# Patient Record
Sex: Female | Born: 1960 | Race: White | Hispanic: No | Marital: Married | Smoking: Current some day smoker
Health system: Southern US, Community
[De-identification: ages and names within clinical notes are randomized; demographics above are authoritative.]

## PROBLEM LIST (undated history)

## (undated) HISTORY — PX: BREAST EXCISIONAL BIOPSY: SUR124

## (undated) HISTORY — PX: NECK SURGERY: SHX720

---

## 2012-07-10 DIAGNOSIS — G4700 Insomnia, unspecified: Secondary | ICD-10-CM | POA: Insufficient documentation

## 2012-07-10 DIAGNOSIS — F419 Anxiety disorder, unspecified: Secondary | ICD-10-CM | POA: Insufficient documentation

## 2013-02-19 DIAGNOSIS — J309 Allergic rhinitis, unspecified: Secondary | ICD-10-CM | POA: Insufficient documentation

## 2013-10-17 ENCOUNTER — Other Ambulatory Visit: Payer: Self-pay

## 2013-10-17 DIAGNOSIS — Z1231 Encounter for screening mammogram for malignant neoplasm of breast: Secondary | ICD-10-CM

## 2013-11-07 ENCOUNTER — Ambulatory Visit: Payer: Self-pay

## 2013-11-07 ENCOUNTER — Ambulatory Visit
Admission: RE | Admit: 2013-11-07 | Discharge: 2013-11-07 | Disposition: A | Discharge status: Home / Self Care | Payer: BC Managed Care – PPO | Source: Ambulatory Visit

## 2013-11-07 ENCOUNTER — Encounter (INDEPENDENT_AMBULATORY_CARE_PROVIDER_SITE_OTHER): Payer: Self-pay

## 2013-11-07 DIAGNOSIS — Z1231 Encounter for screening mammogram for malignant neoplasm of breast: Secondary | ICD-10-CM

## 2013-12-03 ENCOUNTER — Emergency Department: Payer: Self-pay | Admitting: Emergency Medicine

## 2013-12-03 LAB — CBC WITH DIFFERENTIAL/PLATELET
Basophil #: 0 10*3/uL (ref 0.0–0.1)
Basophil %: 0.2 %
EOS PCT: 1.8 %
Eosinophil #: 0.1 10*3/uL (ref 0.0–0.7)
HCT: 40.3 % (ref 35.0–47.0)
HGB: 13.2 g/dL (ref 12.0–16.0)
LYMPHS PCT: 33.4 %
Lymphocyte #: 2.1 10*3/uL (ref 1.0–3.6)
MCH: 31.8 pg (ref 26.0–34.0)
MCHC: 32.7 g/dL (ref 32.0–36.0)
MCV: 97 fL (ref 80–100)
Monocyte #: 0.5 x10 3/mm (ref 0.2–0.9)
Monocyte %: 8.8 %
NEUTROS ABS: 3.4 10*3/uL (ref 1.4–6.5)
NEUTROS PCT: 55.8 %
Platelet: 263 10*3/uL (ref 150–440)
RBC: 4.15 10*6/uL (ref 3.80–5.20)
RDW: 12.1 % (ref 11.5–14.5)
WBC: 6.2 10*3/uL (ref 3.6–11.0)

## 2013-12-03 LAB — COMPREHENSIVE METABOLIC PANEL
ALBUMIN: 3.7 g/dL (ref 3.4–5.0)
ALK PHOS: 63 U/L
ALT: 30 U/L
AST: 30 U/L (ref 15–37)
Anion Gap: 6 — ABNORMAL LOW (ref 7–16)
BUN: 13 mg/dL (ref 7–18)
Bilirubin,Total: 0.3 mg/dL (ref 0.2–1.0)
CHLORIDE: 103 mmol/L (ref 98–107)
Calcium, Total: 8.7 mg/dL (ref 8.5–10.1)
Co2: 31 mmol/L (ref 21–32)
Creatinine: 0.68 mg/dL (ref 0.60–1.30)
EGFR (African American): >60
GLUCOSE: 89 mg/dL (ref 65–99)
Osmolality: 279 (ref 275–301)
Potassium: 3.8 mmol/L (ref 3.5–5.1)
Sodium: 140 mmol/L (ref 136–145)
TOTAL PROTEIN: 6.8 g/dL (ref 6.4–8.2)

## 2013-12-03 LAB — PROTIME-INR
INR: 1
PROTHROMBIN TIME: 12.7 s (ref 11.5–14.7)

## 2013-12-03 LAB — CK TOTAL AND CKMB (NOT AT ARMC)
CK, Total: 57 U/L
CK-MB: 1 ng/mL (ref 0.5–3.6)

## 2013-12-03 LAB — TROPONIN I

## 2013-12-03 LAB — D-DIMER(ARMC): D-Dimer: 297 ng/ml

## 2014-01-01 ENCOUNTER — Other Ambulatory Visit: Payer: Self-pay | Admitting: Neurosurgery

## 2014-01-01 DIAGNOSIS — M5412 Radiculopathy, cervical region: Secondary | ICD-10-CM

## 2014-01-03 ENCOUNTER — Ambulatory Visit
Admission: RE | Admit: 2014-01-03 | Discharge: 2014-01-03 | Disposition: A | Discharge status: Home / Self Care | Payer: BC Managed Care – PPO | Source: Ambulatory Visit | Attending: Neurosurgery | Admitting: Neurosurgery

## 2014-01-03 VITALS — BP 140/81 | HR 80

## 2014-01-03 DIAGNOSIS — M5412 Radiculopathy, cervical region: Secondary | ICD-10-CM

## 2014-01-03 MED ORDER — HYDROMORPHONE HCL 1 MG/ML IJ SOLN
1.0000 mg | Freq: Once | INTRAMUSCULAR | Status: AC
  Administered 2014-01-03: 1 mg via INTRAMUSCULAR

## 2014-01-03 MED ORDER — IOHEXOL 300 MG/ML  SOLN
10.0000 mL | Freq: Once | INTRAMUSCULAR | Status: AC | PRN
  Administered 2014-01-03: 10 mL via INTRATHECAL

## 2014-01-03 MED ORDER — ONDANSETRON HCL 4 MG/2ML IJ SOLN
4.0000 mg | Freq: Once | INTRAMUSCULAR | Status: AC
  Administered 2014-01-03: 4 mg via INTRAMUSCULAR

## 2014-01-03 MED ORDER — DIAZEPAM 5 MG PO TABS
5.0000 mg | ORAL_TABLET | Freq: Once | ORAL | Status: AC
  Administered 2014-01-03: 5 mg via ORAL

## 2014-01-03 NOTE — Discharge Instructions (Signed)
Myelogram Discharge Instructions  1. Go home and rest quietly for the next 24 hours.  It is important to lie flat for the next 24 hours.  Get up only to go to the restroom.  You may lie in the bed or on a couch on your back, your stomach, your left side or your right side.  You may have one pillow under your head.  You may have pillows between your knees while you are on your side or under your knees while you are on your back.  2. DO NOT drive today.  Recline the seat as far back as it will go, while still wearing your seat belt, on the way home.  3. You may get up to go to the bathroom as needed.  You may sit up for 10 minutes to eat.  You may resume your normal diet and medications unless otherwise indicated.  Drink plenty of extra fluids today and tomorrow.  4. The incidence of a spinal headache with nausea and/or vomiting is about 5% (one in 20 patients).  If you develop a headache, lie flat and drink plenty of fluids until the headache goes away.  Caffeinated beverages may be helpful.  If you develop severe nausea and vomiting or a headache that does not go away with flat bed rest, call [phone].  5. You may resume normal activities after your 24 hours of bed rest is over; however, do not exert yourself strongly or do any heavy lifting tomorrow.  6. Call your physician for a follow-up appointment.   You may resume Zoloft on Saturday, January 04, 2014 after 11:30a.m.

## 2014-04-04 DIAGNOSIS — I471 Supraventricular tachycardia, unspecified: Secondary | ICD-10-CM

## 2014-04-04 HISTORY — DX: Supraventricular tachycardia, unspecified: I47.10

## 2014-04-04 HISTORY — DX: Supraventricular tachycardia: I47.1

## 2014-11-24 HISTORY — PX: SVT ABLATION: EP1225

## 2017-04-19 ENCOUNTER — Other Ambulatory Visit: Payer: Self-pay | Admitting: Internal Medicine

## 2017-04-19 DIAGNOSIS — R634 Abnormal weight loss: Secondary | ICD-10-CM

## 2017-04-19 DIAGNOSIS — Z1231 Encounter for screening mammogram for malignant neoplasm of breast: Secondary | ICD-10-CM

## 2017-04-19 DIAGNOSIS — E2839 Other primary ovarian failure: Secondary | ICD-10-CM

## 2017-05-10 ENCOUNTER — Ambulatory Visit: Payer: BC Managed Care – PPO

## 2017-05-10 ENCOUNTER — Inpatient Hospital Stay
Admission: RE | Admit: 2017-05-10 | Discharge: 2017-05-10 | Disposition: A | Discharge status: Home / Self Care | Payer: BC Managed Care – PPO | Source: Ambulatory Visit | Attending: Internal Medicine | Admitting: Internal Medicine

## 2017-06-29 ENCOUNTER — Ambulatory Visit: Payer: BC Managed Care – PPO

## 2017-06-29 ENCOUNTER — Inpatient Hospital Stay
Admission: RE | Admit: 2017-06-29 | Discharge: 2017-06-29 | Disposition: A | Discharge status: Home / Self Care | Payer: BC Managed Care – PPO | Source: Ambulatory Visit | Attending: Internal Medicine | Admitting: Internal Medicine

## 2017-08-17 ENCOUNTER — Inpatient Hospital Stay
Admission: RE | Admit: 2017-08-17 | Discharge: 2017-08-17 | Disposition: A | Discharge status: Home / Self Care | Payer: BC Managed Care – PPO | Source: Ambulatory Visit | Attending: Internal Medicine | Admitting: Internal Medicine

## 2017-08-17 ENCOUNTER — Ambulatory Visit: Payer: BC Managed Care – PPO

## 2017-10-18 ENCOUNTER — Other Ambulatory Visit: Payer: BC Managed Care – PPO

## 2017-10-18 ENCOUNTER — Ambulatory Visit: Payer: BC Managed Care – PPO

## 2018-01-10 ENCOUNTER — Encounter: Payer: Self-pay | Admitting: *Deleted

## 2018-12-20 ENCOUNTER — Other Ambulatory Visit: Payer: Self-pay | Admitting: Internal Medicine

## 2018-12-20 DIAGNOSIS — M810 Age-related osteoporosis without current pathological fracture: Secondary | ICD-10-CM

## 2018-12-20 DIAGNOSIS — Z1231 Encounter for screening mammogram for malignant neoplasm of breast: Secondary | ICD-10-CM

## 2018-12-21 ENCOUNTER — Telehealth: Payer: Self-pay

## 2018-12-21 DIAGNOSIS — Z1211 Encounter for screening for malignant neoplasm of colon: Secondary | ICD-10-CM

## 2018-12-21 MED ORDER — NA SULFATE-K SULFATE-MG SULF 17.5-3.13-1.6 GM/177ML PO SOLN: 1.0000 | Freq: Once | ORAL | 0 refills | Status: AC

## 2018-12-21 NOTE — Telephone Encounter (Signed)
Gastroenterology Pre-Procedure Review  Request Date: PENDING Requesting Physician: Dr. PENDING  PATIENT REVIEW QUESTIONS: The patient responded to the following health history questions as indicated:    1. Are you having any GI issues? no 2. Do you have a personal history of Polyps? no 3. Do you have a family history of Colon Cancer or Polyps? no 4. Diabetes Mellitus? no 5. Joint replacements in the past 12 months?no 6. Major health problems in the past 3 months?no 7. Any artificial heart valves, MVP, or defibrillator?no    MEDICATIONS & ALLERGIES:    Patient reports the following regarding taking any anticoagulation/antiplatelet therapy:   Plavix, Coumadin, Eliquis, Xarelto, Lovenox, Pradaxa, Brilinta, or Effient? no Aspirin? no  Patient confirms/reports the following medications:  Current Outpatient Medications  Medication Sig Dispense Refill  . albuterol (PROAIR HFA) 108 (90 BASE) MCG/ACT inhaler Inhale into the lungs.    . Ascorbic Acid (RA VITAMIN C/ROSE HIPS CR) 1000 MG TBCR Take 1,000 mg by mouth.    . budesonide (RHINOCORT AQUA) 32 MCG/ACT nasal spray Place into the nose.    . Calcium Carbonate-Vitamin D 600-400 MG-UNIT per tablet Take by mouth.    . fluticasone (FLONASE) 50 MCG/ACT nasal spray 1 spray by Each Nare route daily as needed for rhinitis.    . Multiple Vitamins-Iron (MULTIVITAMINS WITH IRON) TABS tablet Take by mouth.    . sertraline (ZOLOFT) 100 MG tablet TAKE 1 1/2 TABLETS BY MOUTH EVERY DAY     No current facility-administered medications for this visit.     Patient confirms/reports the following allergies:  Allergies  Allergen Reactions  . Cefuroxime Hives and Itching    No orders of the defined types were placed in this encounter.   AUTHORIZATION INFORMATION Primary Insurance: 1D#: Group #:  Secondary Insurance: 1D#: Group #:  SCHEDULE INFORMATION: Date: PENDING Time: Location:[facility]

## 2018-12-24 ENCOUNTER — Telehealth: Payer: Self-pay | Admitting: Gastroenterology

## 2018-12-24 NOTE — Telephone Encounter (Signed)
Patient called & was asking to schedule a colonoscopy and was ask to call Sharyn Lull back from on Monday.

## 2018-12-25 ENCOUNTER — Other Ambulatory Visit: Payer: Self-pay

## 2018-12-25 ENCOUNTER — Telehealth: Payer: Self-pay | Admitting: Gastroenterology

## 2018-12-25 DIAGNOSIS — Z1211 Encounter for screening for malignant neoplasm of colon: Secondary | ICD-10-CM

## 2018-12-25 NOTE — Telephone Encounter (Signed)
LVM returning patients call to schedule her colonoscopy.  Informed her that I am currently working from home, but call the main number and ask front office to tell Sharyn Lull to call her back to schedule.  Made her aware that I will be calling back from a restricted number.  Thanks, Sharyn Lull

## 2018-12-25 NOTE — Telephone Encounter (Signed)
Pt missed your call I have updated her number  For you to call her back she is aware of Private call

## 2018-12-25 NOTE — Telephone Encounter (Signed)
Colonoscopy scheduled with Dr. Allen Norris at [facility] on 02/22/19 pt advised to go for COVID testing on 02/19/19 at [place].  New instructions reflecting her procedure date will be mailed.  Thanks [employer]

## 2019-01-30 ENCOUNTER — Other Ambulatory Visit: Payer: Self-pay | Admitting: *Deleted

## 2019-01-30 DIAGNOSIS — Z20822 Contact with and (suspected) exposure to covid-19: Secondary | ICD-10-CM

## 2019-01-31 LAB — NOVEL CORONAVIRUS, NAA: SARS-CoV-2, NAA: NOT DETECTED

## 2019-02-18 ENCOUNTER — Other Ambulatory Visit: Payer: Self-pay

## 2019-02-18 ENCOUNTER — Encounter: Payer: Self-pay | Admitting: *Deleted

## 2019-02-18 NOTE — Anesthesia Preprocedure Evaluation (Addendum)
Anesthesia Evaluation  Patient identified by MRN, date of birth, ID band Patient awake    Reviewed: Allergy & Precautions, NPO status , Patient's Chart, lab work & pertinent test results  History of Anesthesia Complications Negative for: history of anesthetic complications  Airway Mallampati: II  TM Distance: >3 FB Neck ROM: Full    Dental   Pulmonary asthma , Current Smoker and Patient abstained from smoking.,    breath sounds clear to auscultation       Cardiovascular (-) angina(-) DOE + dysrhythmias (AVNRT s/p successful ablation)  Rhythm:Regular Rate:Normal     Neuro/Psych PSYCHIATRIC DISORDERS Anxiety Depression    GI/Hepatic neg GERD  ,  Endo/Other    Renal/GU      Musculoskeletal   Abdominal   Peds  Hematology   Anesthesia Other Findings Cardiology note 01/21/15:  1. S/p AVNRT ablation Mrs. Freund has done well after her AVNRT ablation. She has not had any further palpitations and no issues from the procedure. At this point she should not need any further specific therapy from an arrhythmia point of view, but we would be happy to see her back at any point in the unlikely event that her palpitations recur. - ECG 12 Lead  Return if symptoms worsen or fail to improve.   Reproductive/Obstetrics                           Anesthesia Physical Anesthesia Plan  ASA: II  Anesthesia Plan: General   Post-op Pain Management:    Induction: Intravenous  PONV Risk Score and Plan: 2 and Propofol infusion, TIVA and Treatment may vary due to age or medical condition  Airway Management Planned: Natural Airway and Nasal Cannula  Additional Equipment:   Intra-op Plan:   Post-operative Plan:   Informed Consent: I have reviewed the patients History and Physical, chart, labs and discussed the procedure including the risks, benefits and alternatives for the proposed anesthesia with the patient  or authorized representative who has indicated his/her understanding and acceptance.       Plan Discussed with: CRNA and Anesthesiologist  Anesthesia Plan Comments:         Anesthesia Quick Evaluation

## 2019-02-19 ENCOUNTER — Other Ambulatory Visit
Admission: RE | Admit: 2019-02-19 | Discharge: 2019-02-19 | Disposition: A | Discharge status: Home / Self Care | Payer: BC Managed Care – PPO | Source: Ambulatory Visit | Attending: Gastroenterology | Admitting: Gastroenterology

## 2019-02-19 DIAGNOSIS — Z01812 Encounter for preprocedural laboratory examination: Secondary | ICD-10-CM | POA: Insufficient documentation

## 2019-02-19 DIAGNOSIS — Z20828 Contact with and (suspected) exposure to other viral communicable diseases: Secondary | ICD-10-CM | POA: Insufficient documentation

## 2019-02-20 LAB — SARS CORONAVIRUS 2 (TAT 6-24 HRS): SARS Coronavirus 2: NEGATIVE

## 2019-02-21 NOTE — Discharge Instructions (Signed)

## 2019-02-22 ENCOUNTER — Ambulatory Visit: Payer: BC Managed Care – PPO | Admitting: Anesthesiology

## 2019-02-22 ENCOUNTER — Ambulatory Visit
Admission: RE | Admit: 2019-02-22 | Discharge: 2019-02-22 | Disposition: A | Discharge status: Home / Self Care | Payer: BC Managed Care – PPO | Attending: Gastroenterology | Admitting: Gastroenterology

## 2019-02-22 ENCOUNTER — Encounter
Admission: RE | Disposition: A | Discharge status: Home / Self Care | Payer: Self-pay | Source: Home / Self Care | Attending: Gastroenterology

## 2019-02-22 ENCOUNTER — Other Ambulatory Visit: Payer: Self-pay

## 2019-02-22 DIAGNOSIS — F329 Major depressive disorder, single episode, unspecified: Secondary | ICD-10-CM | POA: Insufficient documentation

## 2019-02-22 DIAGNOSIS — Z79899 Other long term (current) drug therapy: Secondary | ICD-10-CM | POA: Diagnosis not present

## 2019-02-22 DIAGNOSIS — F17210 Nicotine dependence, cigarettes, uncomplicated: Secondary | ICD-10-CM | POA: Insufficient documentation

## 2019-02-22 DIAGNOSIS — K640 First degree hemorrhoids: Secondary | ICD-10-CM | POA: Diagnosis not present

## 2019-02-22 DIAGNOSIS — D124 Benign neoplasm of descending colon: Secondary | ICD-10-CM | POA: Insufficient documentation

## 2019-02-22 DIAGNOSIS — K635 Polyp of colon: Secondary | ICD-10-CM

## 2019-02-22 DIAGNOSIS — D123 Benign neoplasm of transverse colon: Secondary | ICD-10-CM | POA: Diagnosis not present

## 2019-02-22 DIAGNOSIS — F419 Anxiety disorder, unspecified: Secondary | ICD-10-CM | POA: Diagnosis not present

## 2019-02-22 DIAGNOSIS — Z881 Allergy status to other antibiotic agents status: Secondary | ICD-10-CM | POA: Diagnosis not present

## 2019-02-22 DIAGNOSIS — Z1211 Encounter for screening for malignant neoplasm of colon: Secondary | ICD-10-CM

## 2019-02-22 HISTORY — PX: COLONOSCOPY WITH PROPOFOL: SHX5780

## 2019-02-22 HISTORY — PX: POLYPECTOMY: SHX5525

## 2019-02-22 SURGERY — COLONOSCOPY WITH PROPOFOL
Anesthesia: General | Site: Rectum

## 2019-02-22 MED ORDER — STERILE WATER FOR IRRIGATION IR SOLN
Status: DC | PRN
  Administered 2019-02-22: 50 mL

## 2019-02-22 MED ORDER — PROPOFOL 10 MG/ML IV BOLUS
INTRAVENOUS | Status: DC | PRN
  Administered 2019-02-22: 50 mg via INTRAVENOUS
  Administered 2019-02-22: 100 mg via INTRAVENOUS
  Administered 2019-02-22: 50 mg via INTRAVENOUS

## 2019-02-22 MED ORDER — LIDOCAINE HCL (CARDIAC) PF 100 MG/5ML IV SOSY
PREFILLED_SYRINGE | INTRAVENOUS | Status: DC | PRN
  Administered 2019-02-22: 40 mg via INTRAVENOUS

## 2019-02-22 MED ORDER — LACTATED RINGERS IV SOLN
100.0000 mL/h | INTRAVENOUS | Status: DC
  Administered 2019-02-22: 100 mL/h via INTRAVENOUS

## 2019-02-22 SURGICAL SUPPLY — 7 items
CANISTER SUCT 1200ML W/VALVE (MISCELLANEOUS) ×3 IMPLANT
GOWN CVR UNV OPN BCK APRN NK (MISCELLANEOUS) ×2 IMPLANT
GOWN ISOL THUMB LOOP REG UNIV (MISCELLANEOUS) ×4
KIT ENDO PROCEDURE OLY (KITS) ×3 IMPLANT
SNARE SHORT THROW 13M SML OVAL (MISCELLANEOUS) ×2 IMPLANT
TRAP ETRAP POLY (MISCELLANEOUS) ×2 IMPLANT
WATER STERILE IRR 250ML POUR (IV SOLUTION) ×3 IMPLANT

## 2019-02-22 NOTE — Op Note (Signed)
[facility] Gastroenterology Patient Name: Teresa Dillon Procedure Date: 02/22/2019 7:58 AM MRN: [id number] Account #: [id number] Date of Birth: Jul 11, 1960 Admit Type: Outpatient Age: 58 Room: [facility] OR ROOM 01 Gender: Female Note Status: Finalized Procedure:             Colonoscopy Indications:           Screening for colorectal malignant neoplasm Providers:             Lucilla Lame MD, MD Referring MD:          Jilda Panda (Referring MD) Medicines:             Propofol per Anesthesia Complications:         No immediate complications. Procedure:             Pre-Anesthesia Assessment:                        - Prior to the procedure, a History and Physical was                         performed, and patient medications and allergies were                         reviewed. The patient's tolerance of previous                         anesthesia was also reviewed. The risks and benefits                         of the procedure and the sedation options and risks                         were discussed with the patient. All questions were                         answered, and informed consent was obtained. Prior                         Anticoagulants: The patient has taken no previous                         anticoagulant or antiplatelet agents. ASA Grade                         Assessment: II - A patient with mild systemic disease.                         After reviewing the risks and benefits, the patient                         was deemed in satisfactory condition to undergo the                         procedure.                        After obtaining informed consent, the colonoscope was  passed under direct vision. Throughout the procedure,                         the patient's blood pressure, pulse, and oxygen                         saturations were monitored continuously. The                         Colonoscope was introduced through the anus  and                         advanced to the the cecum, identified by appendiceal                         orifice and ileocecal valve. The colonoscopy was                         performed without difficulty. The patient tolerated                         the procedure well. The quality of the bowel                         preparation was excellent. Findings:      The perianal and digital rectal examinations were normal.      A 7 mm polyp was found in the transverse colon. The polyp was sessile.       The polyp was removed with a cold snare. Resection and retrieval were       complete.      A 3 mm polyp was found in the descending colon. The polyp was sessile.       The polyp was removed with a cold snare. Resection and retrieval were       complete.      Non-bleeding internal hemorrhoids were found during retroflexion. The       hemorrhoids were Grade I (internal hemorrhoids that do not prolapse). Impression:            - One 7 mm polyp in the transverse colon, removed with                         a cold snare. Resected and retrieved.                        - One 3 mm polyp in the descending colon, removed with                         a cold snare. Resected and retrieved.                        - Non-bleeding internal hemorrhoids. Recommendation:        - Discharge patient to home.                        - Resume previous diet.                        - Continue present medications.                        -  Await pathology results.                        - Repeat colonoscopy in 5 years if polyp adenoma and                         10 years if hyperplastic Procedure Code(s):     --- Professional ---                        [phone], Colonoscopy, flexible; with removal of                         tumor(s), polyp(s), or other lesion(s) by snare                         technique Diagnosis Code(s):     --- Professional ---                        Z12.11, Encounter for screening for malignant  neoplasm                         of colon                        K63.5, Polyp of colon CPT copyright 2019 American Medical Association. All rights reserved. The codes documented in this report are preliminary and upon coder review may  be revised to meet current compliance requirements. Lucilla Lame MD, MD 02/22/2019 8:18:00 AM This report has been signed electronically. Number of Addenda: 0 Note Initiated On: 02/22/2019 7:58 AM Scope Withdrawal Time: 0 hours 7 minutes 22 seconds  Total Procedure Duration: 0 hours 11 minutes 51 seconds  Estimated Blood Loss:  Estimated blood loss: none.      [facility]

## 2019-02-22 NOTE — H&P (Signed)
Lucilla Lame, MD [facility] [address] [place], [place] [address] Phone: [phone] Fax : [phone]  Primary Care Physician:  Jilda Panda, MD Primary Gastroenterologist:  Dr. Allen Norris  Pre-Procedure History & Physical: HPI:  Teresa Dillon is a 58 y.o. female is here for a screening colonoscopy.   Past Medical History:  Diagnosis Date  . SVT (supraventricular tachycardia) ([place]) 2016   had ablation     Past Surgical History:  Procedure Laterality Date  . NECK SURGERY     disc replacement  . SVT ABLATION  11/24/2014   [facility]    Prior to Admission medications   Medication Sig Start Date End Date Taking? Authorizing Provider  albuterol (PROAIR HFA) 108 (90 BASE) MCG/ACT inhaler Inhale into the lungs. 02/19/13  Yes [provider]  Ascorbic Acid (RA VITAMIN C/ROSE HIPS CR) 1000 MG TBCR Take 1,000 mg by mouth. 12/27/07  Yes [provider]  B Complex Vitamins (B COMPLEX PO) Take by mouth daily.   Yes [provider]  Calcium Carbonate-Vitamin D 600-400 MG-UNIT per tablet Take by mouth. 12/27/07  Yes [provider]  fluticasone (FLONASE) 50 MCG/ACT nasal spray 1 spray by Each Nare route daily as needed for rhinitis.   Yes [provider]  Multiple Vitamins-Iron (MULTIVITAMINS WITH IRON) TABS tablet Take by mouth. 12/27/07  Yes [provider]  sertraline (ZOLOFT) 100 MG tablet TAKE 1 1/2 TABLETS BY MOUTH EVERY DAY 11/05/12  Yes [provider]  zolpidem (AMBIEN) 10 MG tablet Take 10 mg by mouth at bedtime as needed for sleep.   Yes [provider]    Allergies as of 12/25/2018 - Review Complete 01/03/2014  Allergen Reaction Noted  . Cefuroxime Hives and Itching 01/03/2014    History reviewed. No pertinent family history.  Social History   Socioeconomic History  . Marital status: Married    Spouse name: Not on file  . Number of children: Not on file  . Years of education: Not on file  .  Highest education level: Not on file  Occupational History  . Not on file  Social Needs  . Financial resource strain: Not on file  . Food insecurity    Worry: Not on file    Inability: Not on file  . Transportation needs    Medical: Not on file    Non-medical: Not on file  Tobacco Use  . Smoking status: Current Some Day Smoker    Packs/day: 0.10    Years: 15.00    Pack years: 1.50    Types: Cigarettes  . Smokeless tobacco: Never Used  Substance and Sexual Activity  . Alcohol use: Yes    Alcohol/week: 8.0 standard drinks    Types: 8 Standard drinks or equivalent per week  . Drug use: Not on file  . Sexual activity: Not on file  Lifestyle  . Physical activity    Days per week: Not on file    Minutes per session: Not on file  . Stress: Not on file  Relationships  . Social Herbalist on phone: Not on file    Gets together: Not on file    Attends religious service: Not on file    Active member of club or organization: Not on file    Attends meetings of clubs or organizations: Not on file    Relationship status: Not on file  . Intimate partner violence    Fear of current or ex partner: Not on file  Emotionally abused: Not on file    Physically abused: Not on file    Forced sexual activity: Not on file  Other Topics Concern  . Not on file  Social History Narrative  . Not on file    Review of Systems: See HPI, otherwise negative ROS  Physical Exam: BP (!) 151/91   Pulse 89   Temp (!) 97.5 F (36.4 C) (Temporal)   Resp 16   Ht 5' 2.5" (1.588 m)   Wt 45.8 kg   SpO2 100%   BMI 18.18 kg/m  General:   Alert,  pleasant and cooperative in NAD Head:  Normocephalic and atraumatic. Neck:  Supple; no masses or thyromegaly. Lungs:  Clear throughout to auscultation.    Heart:  Regular rate and rhythm. Abdomen:  Soft, nontender and nondistended. Normal bowel sounds, without guarding, and without rebound.   Neurologic:  Alert and  oriented x4;  grossly normal  neurologically.  Impression/Plan: Teresa Dillon is now here to undergo a screening colonoscopy.  Risks, benefits, and alternatives regarding colonoscopy have been reviewed with the patient.  Questions have been answered.  All parties agreeable.

## 2019-02-22 NOTE — Transfer of Care (Signed)
Immediate Anesthesia Transfer of Care Note  Patient: Teresa Dillon  Procedure(s) Performed: COLONOSCOPY WITH BIOPSIES (N/A Rectum) POLYPECTOMY (N/A Rectum)  Patient Location: PACU  Anesthesia Type: General  Level of Consciousness: awake, alert  and patient cooperative  Airway and Oxygen Therapy: Patient Spontanous Breathing and Patient connected to supplemental oxygen  Post-op Assessment: Post-op Vital signs reviewed, Patient's Cardiovascular Status Stable, Respiratory Function Stable, Patent Airway and No signs of Nausea or vomiting  Post-op Vital Signs: Reviewed and stable  Complications: No apparent anesthesia complications

## 2019-02-22 NOTE — Anesthesia Procedure Notes (Signed)
Date/Time: 02/22/2019 8:07 AM Performed by: Jeannene Patella, CRNA Pre-anesthesia Checklist: Patient identified, Emergency Drugs available, Suction available, Patient being monitored and Timeout performed Patient Re-evaluated:Patient Re-evaluated prior to induction Oxygen Delivery Method: Nasal cannula

## 2019-02-22 NOTE — Anesthesia Postprocedure Evaluation (Signed)
Anesthesia Post Note  Patient: Teresa Dillon  Procedure(s) Performed: COLONOSCOPY WITH BIOPSIES (N/A Rectum) POLYPECTOMY (N/A Rectum)     Patient location during evaluation: PACU Anesthesia Type: General Level of consciousness: awake and alert Pain management: pain level controlled Vital Signs Assessment: post-procedure vital signs reviewed and stable Respiratory status: spontaneous breathing, nonlabored ventilation, respiratory function stable and patient connected to nasal cannula oxygen Cardiovascular status: blood pressure returned to baseline and stable Postop Assessment: no apparent nausea or vomiting Anesthetic complications: no    Sejla Marzano A  Tyan Dy

## 2019-02-25 ENCOUNTER — Encounter: Payer: Self-pay | Admitting: Gastroenterology

## 2019-03-04 ENCOUNTER — Encounter: Payer: Self-pay | Admitting: Gastroenterology

## 2019-03-05 ENCOUNTER — Encounter: Payer: Self-pay | Admitting: Gastroenterology

## 2019-03-13 ENCOUNTER — Other Ambulatory Visit: Payer: Self-pay | Admitting: Internal Medicine

## 2019-03-13 DIAGNOSIS — Z13820 Encounter for screening for osteoporosis: Secondary | ICD-10-CM

## 2019-03-14 ENCOUNTER — Other Ambulatory Visit: Payer: Self-pay

## 2019-03-14 ENCOUNTER — Ambulatory Visit
Admission: RE | Admit: 2019-03-14 | Discharge: 2019-03-14 | Disposition: A | Discharge status: Home / Self Care | Payer: BC Managed Care – PPO | Source: Ambulatory Visit | Attending: Internal Medicine | Admitting: Internal Medicine

## 2019-03-14 DIAGNOSIS — Z1231 Encounter for screening mammogram for malignant neoplasm of breast: Secondary | ICD-10-CM

## 2019-03-14 DIAGNOSIS — Z13820 Encounter for screening for osteoporosis: Secondary | ICD-10-CM

## 2019-05-09 ENCOUNTER — Ambulatory Visit: Payer: Self-pay | Admitting: Obstetrics and Gynecology

## 2019-05-09 ENCOUNTER — Other Ambulatory Visit: Payer: Self-pay

## 2019-05-10 ENCOUNTER — Ambulatory Visit: Payer: BC Managed Care – PPO | Admitting: Obstetrics and Gynecology

## 2019-05-10 ENCOUNTER — Encounter: Payer: Self-pay | Admitting: Obstetrics and Gynecology

## 2019-05-10 VITALS — BP 118/70 | Ht 62.5 in | Wt 102.0 lb

## 2019-05-10 DIAGNOSIS — Z1151 Encounter for screening for human papillomavirus (HPV): Secondary | ICD-10-CM | POA: Diagnosis not present

## 2019-05-10 DIAGNOSIS — Z124 Encounter for screening for malignant neoplasm of cervix: Secondary | ICD-10-CM

## 2019-05-10 NOTE — Patient Instructions (Signed)
Plan to repeat the DEXA scan late in 2022 or early 2023 Continue vitamin D and calcium supplementation and weight bearing exercise Avoid smoking if possible

## 2019-05-10 NOTE — Progress Notes (Signed)
   [facility] 07/10/1960 [id number]  SUBJECTIVE:  59 y.o. G0P0000 female for annual routine gynecologic exam and Pap smear.  She is new to our office.  She has no gynecologic concerns at this time.  Current Outpatient Medications  Medication Sig Dispense Refill  . Ascorbic Acid (RA VITAMIN C/ROSE HIPS CR) 1000 MG TBCR Take 1,000 mg by mouth.    . B Complex Vitamins (B COMPLEX PO) Take by mouth daily.    Marland Kitchen buPROPion (WELLBUTRIN XL) 150 MG 24 hr tablet Take 150 mg by mouth daily.    . Calcium Carbonate-Vitamin D 600-400 MG-UNIT per tablet Take by mouth.    . fluticasone (FLONASE) 50 MCG/ACT nasal spray 1 spray by Each Nare route daily as needed for rhinitis.    Marland Kitchen sertraline (ZOLOFT) 100 MG tablet TAKE 1 1/2 TABLETS BY MOUTH EVERY DAY    . zolpidem (AMBIEN) 10 MG tablet Take 10 mg by mouth at bedtime as needed for sleep.    Marland Kitchen albuterol (PROAIR HFA) 108 (90 BASE) MCG/ACT inhaler Inhale into the lungs.     No current facility-administered medications for this visit.   Allergies: Cefuroxime  No LMP recorded. Patient is postmenopausal.  Past medical history,surgical history, problem list, medications, allergies, family history and social history were all reviewed and documented as reviewed in the EPIC chart.  ROS:  Feeling well. No dyspnea or chest pain on exertion.  No abdominal pain, change in bowel habits, black or bloody stools.  No urinary tract symptoms. GYN ROS: no abnormal bleeding, pelvic pain or discharge, no breast pain or new or enlarging lumps on self exam. No neurological complaints.  OBJECTIVE:  BP 118/70   Ht 5' 2.5" (1.588 m)   Wt 102 lb (46.3 kg)   BMI 18.36 kg/m  The patient appears well, alert, oriented x 3, in no distress. ENT normal.  Neck supple. No cervical or supraclavicular adenopathy or thyromegaly.  Lungs are clear, good air entry, no wheezes, rhonchi or rales. S1 and S2 normal, no murmurs, regular rate and rhythm.  Abdomen soft without tenderness,  guarding, mass or organomegaly.  Neurological is normal, no focal findings.  BREAST EXAM: breasts appear normal, no suspicious masses, no skin or nipple changes or axillary nodes  PELVIC EXAM: VULVA: normal appearing vulva with no masses, tenderness or lesions, atrophic changes, VAGINA: normal appearing vagina with normal color and discharge, no lesions, CERVIX: normal appearing cervix without discharge or lesions, UTERUS: uterus is normal size, shape, consistency and nontender, ADNEXA: normal adnexa in size, nontender and no masses  Chaperone: Caryn Bee present during the examination  ASSESSMENT:  59 y.o. G0P0000 here for annual gynecologic exam  PLAN:   1. Postmenopausal for about 8 years. Manageable hot flashes, minimal night sweats, no concerns. 2. Pap smear last done several years ago.  Pap smear is collected today. 3. Mammogram 03/2019. Will continue with annual mammography. Breast exam normal today. 4. Colonoscopy 2020. Recommended that she continue per the prescribed interval.   5. DEXA 03/2019 indicated osteopenia.  Encouraged regular weightbearing exercise and continue vitamin D and calcium supplementation.  Recommend follow-up DEXA in late 2022.  Smoking cessation encouraged. 6. Health maintenance.  No lab work as she has this completed elsewhere. .   Return annually or sooner, prn.  Joseph Pierini MD  05/10/19

## 2019-05-14 LAB — PAP IG AND HPV HIGH-RISK: HPV DNA High Risk: NOT DETECTED

## 2020-02-03 ENCOUNTER — Other Ambulatory Visit: Payer: Self-pay | Admitting: Internal Medicine

## 2020-02-03 DIAGNOSIS — Z1231 Encounter for screening mammogram for malignant neoplasm of breast: Secondary | ICD-10-CM

## 2020-02-11 ENCOUNTER — Other Ambulatory Visit: Payer: Self-pay | Admitting: Orthopedic Surgery

## 2020-02-11 ENCOUNTER — Ambulatory Visit
Admission: RE | Admit: 2020-02-11 | Discharge: 2020-02-11 | Disposition: A | Discharge status: Home / Self Care | Payer: BC Managed Care – PPO | Source: Ambulatory Visit | Attending: Orthopedic Surgery | Admitting: Orthopedic Surgery

## 2020-02-11 DIAGNOSIS — M545 Low back pain, unspecified: Secondary | ICD-10-CM

## 2020-02-11 DIAGNOSIS — J180 Bronchopneumonia, unspecified organism: Secondary | ICD-10-CM

## 2020-03-11 ENCOUNTER — Other Ambulatory Visit: Payer: Self-pay | Admitting: Orthopedic Surgery

## 2020-03-11 DIAGNOSIS — M259 Joint disorder, unspecified: Secondary | ICD-10-CM

## 2020-03-17 ENCOUNTER — Other Ambulatory Visit: Payer: Self-pay

## 2020-03-17 ENCOUNTER — Ambulatory Visit
Admission: RE | Admit: 2020-03-17 | Discharge: 2020-03-17 | Disposition: A | Discharge status: Home / Self Care | Payer: BC Managed Care – PPO | Source: Ambulatory Visit | Attending: Internal Medicine | Admitting: Internal Medicine

## 2020-03-17 DIAGNOSIS — Z1231 Encounter for screening mammogram for malignant neoplasm of breast: Secondary | ICD-10-CM

## 2020-03-31 ENCOUNTER — Ambulatory Visit
Admission: RE | Admit: 2020-03-31 | Discharge: 2020-03-31 | Disposition: A | Discharge status: Home / Self Care | Payer: BC Managed Care – PPO | Source: Ambulatory Visit | Attending: Orthopedic Surgery | Admitting: Orthopedic Surgery

## 2020-03-31 ENCOUNTER — Other Ambulatory Visit: Payer: Self-pay

## 2020-03-31 DIAGNOSIS — M259 Joint disorder, unspecified: Secondary | ICD-10-CM

## 2020-04-21 ENCOUNTER — Other Ambulatory Visit: Payer: Self-pay | Admitting: Orthopedic Surgery

## 2020-04-21 DIAGNOSIS — G8929 Other chronic pain: Secondary | ICD-10-CM

## 2020-04-21 DIAGNOSIS — M545 Low back pain, unspecified: Secondary | ICD-10-CM

## 2020-04-29 ENCOUNTER — Inpatient Hospital Stay: Admission: RE | Admit: 2020-04-29 | Payer: BC Managed Care – PPO | Source: Ambulatory Visit

## 2020-05-12 ENCOUNTER — Ambulatory Visit
Admission: RE | Admit: 2020-05-12 | Discharge: 2020-05-12 | Disposition: A | Discharge status: Home / Self Care | Payer: BC Managed Care – PPO | Source: Ambulatory Visit | Attending: Orthopedic Surgery | Admitting: Orthopedic Surgery

## 2020-05-12 DIAGNOSIS — G8929 Other chronic pain: Secondary | ICD-10-CM

## 2020-05-12 DIAGNOSIS — M545 Low back pain, unspecified: Secondary | ICD-10-CM

## 2020-05-12 MED ORDER — METHYLPREDNISOLONE ACETATE 40 MG/ML INJ SUSP (RADIOLOG
120.0000 mg | Freq: Once | INTRAMUSCULAR | Status: AC
  Administered 2020-05-12: 120 mg via EPIDURAL

## 2020-05-12 MED ORDER — IOPAMIDOL (ISOVUE-M 200) INJECTION 41%
1.0000 mL | Freq: Once | INTRAMUSCULAR | Status: AC
  Administered 2020-05-12: 1 mL via EPIDURAL

## 2020-05-12 NOTE — Discharge Instructions (Signed)

## 2020-05-13 ENCOUNTER — Encounter: Payer: BC Managed Care – PPO | Admitting: Obstetrics and Gynecology

## 2020-06-04 IMAGING — MG DIGITAL SCREENING BILAT W/ TOMO W/ CAD
8 series · 9 of 24 positions shown · non-contrast
Comparison: Previous exam(s).

CLINICAL DATA: Screening.

EXAM:
DIGITAL SCREENING BILATERAL MAMMOGRAM WITH TOMO AND CAD

[R CC synth-2D]
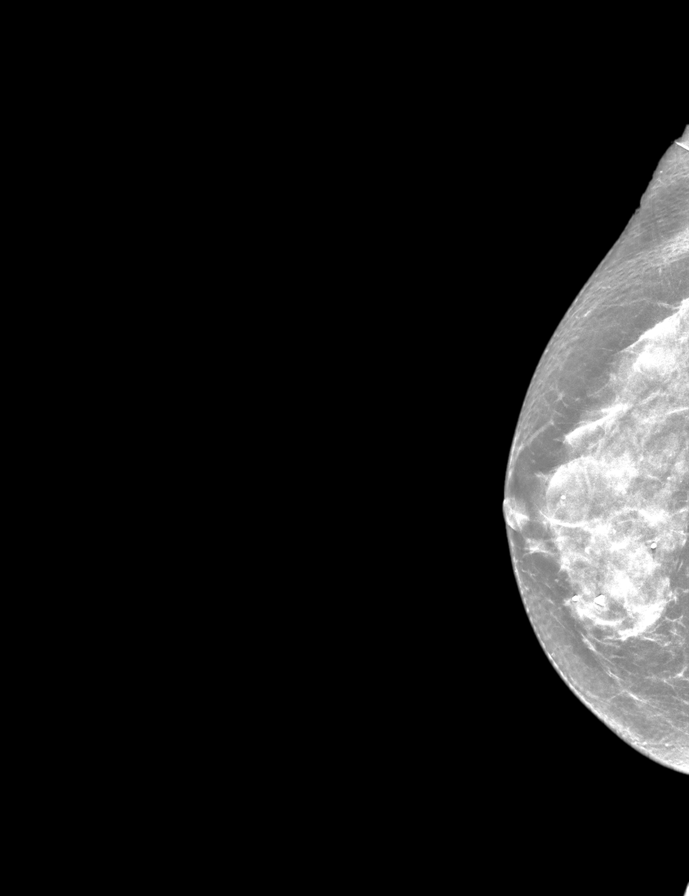

[R MLO synth-2D]
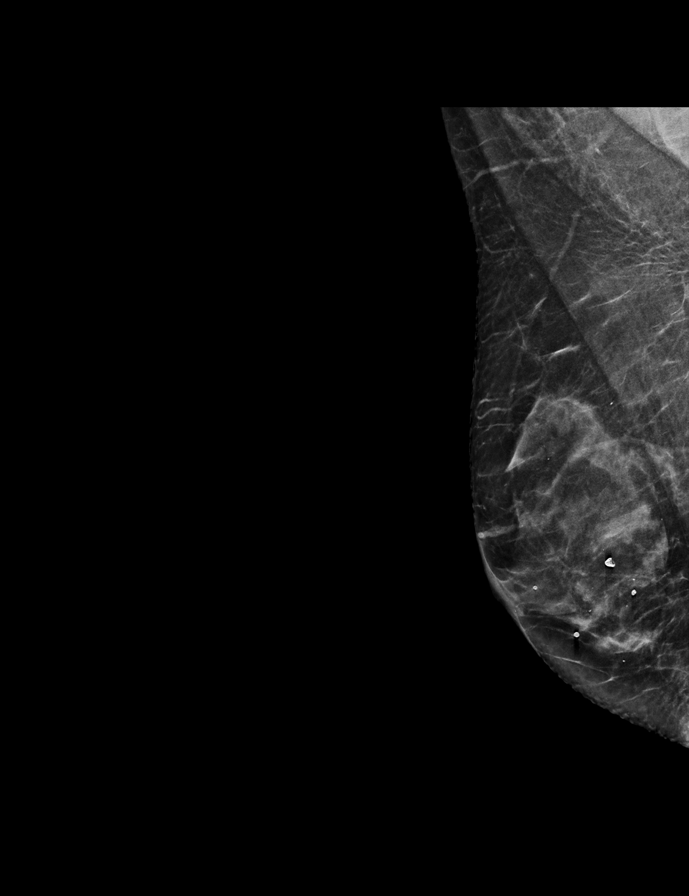

[L MLO synth-2D]
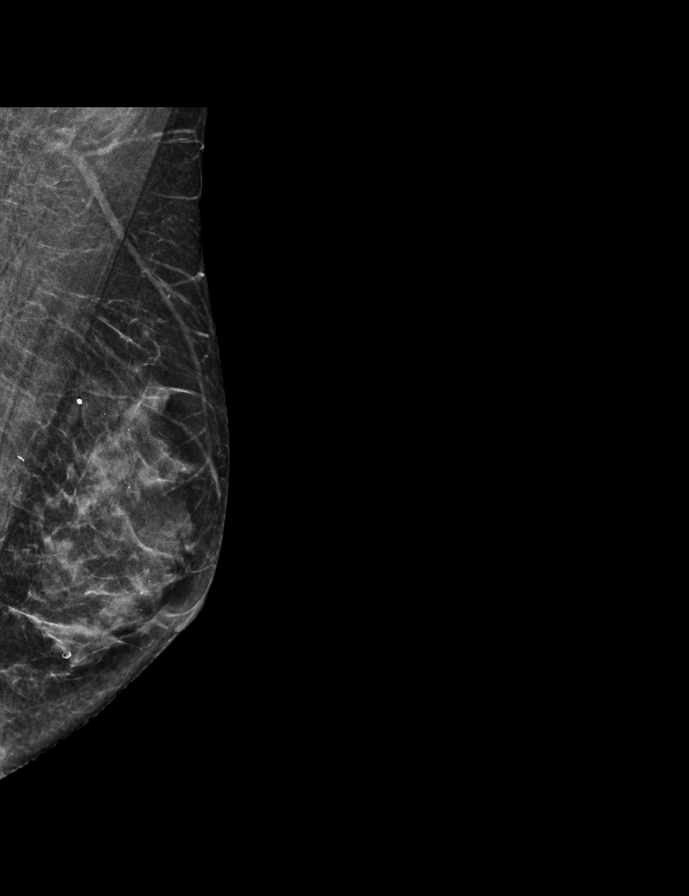

[L CC synth-2D]
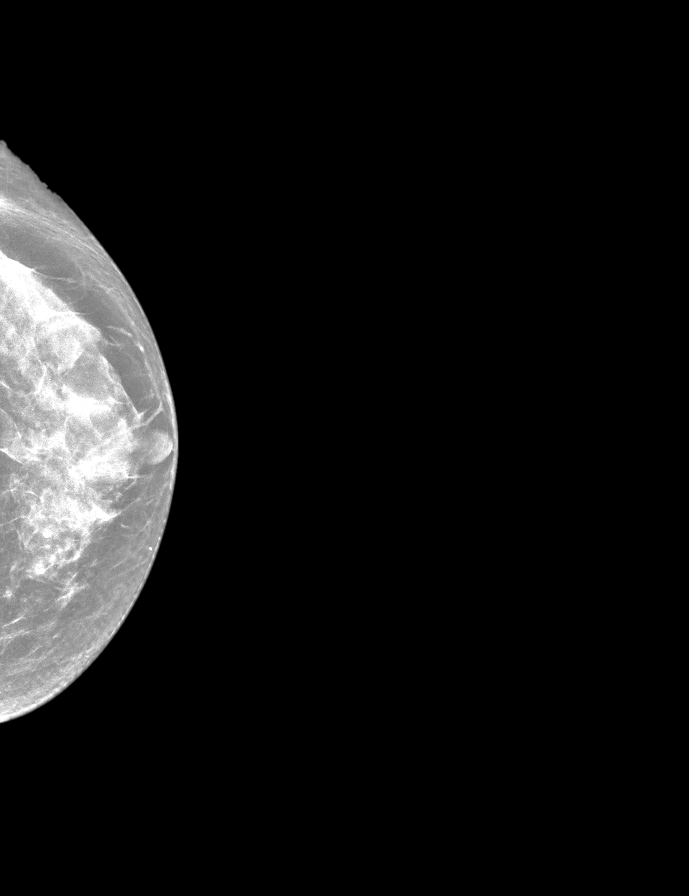

[L MLO tomo · 2 of 48 frames shown]
[frame 16/48]
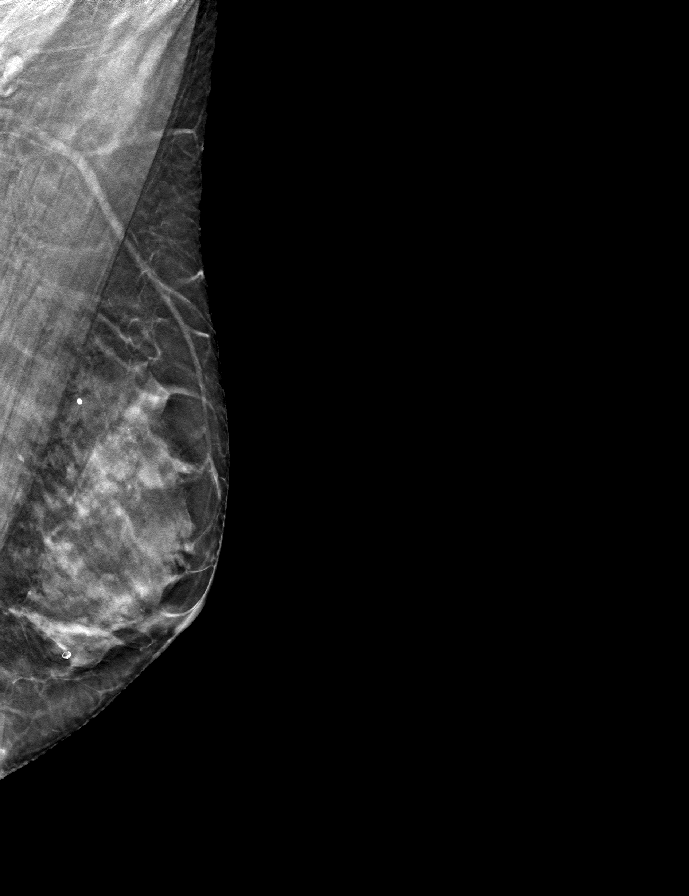
[frame 25/48]
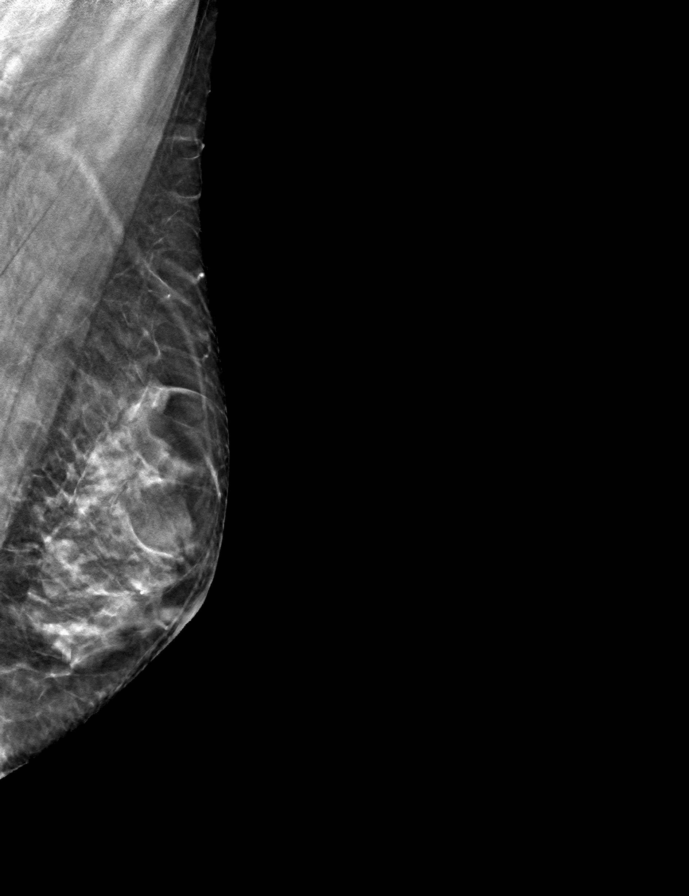

[R MLO tomo · tomo slice 27/52.0]
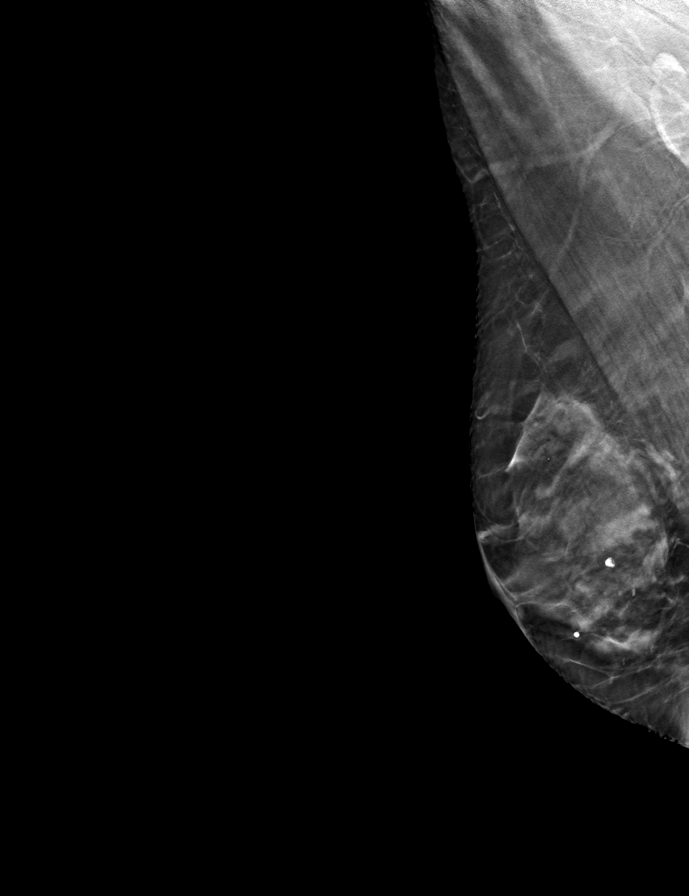

[L CC tomo · tomo slice 26/51.0]
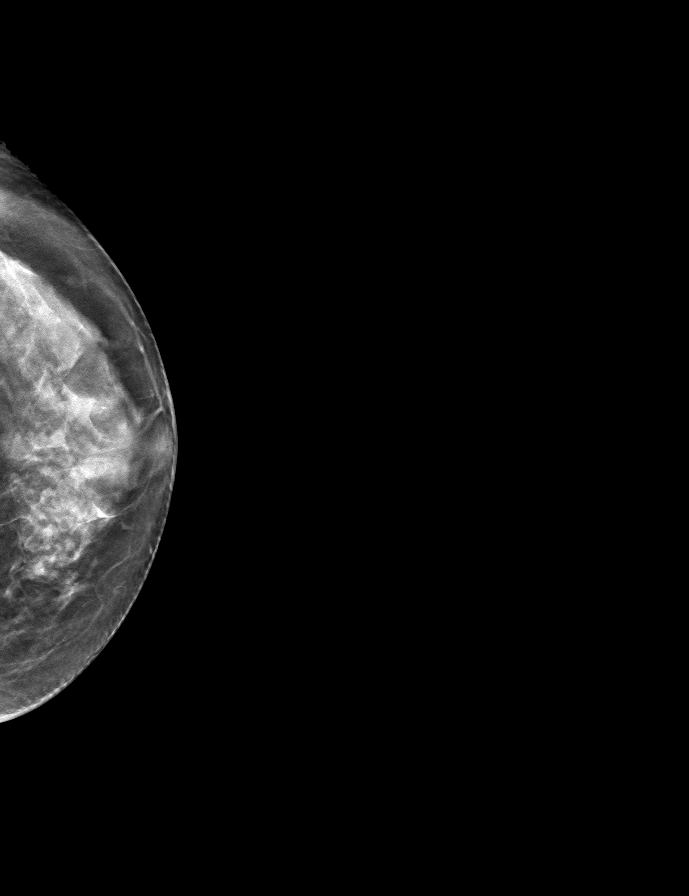

[R CC tomo · tomo slice 27/53.0]
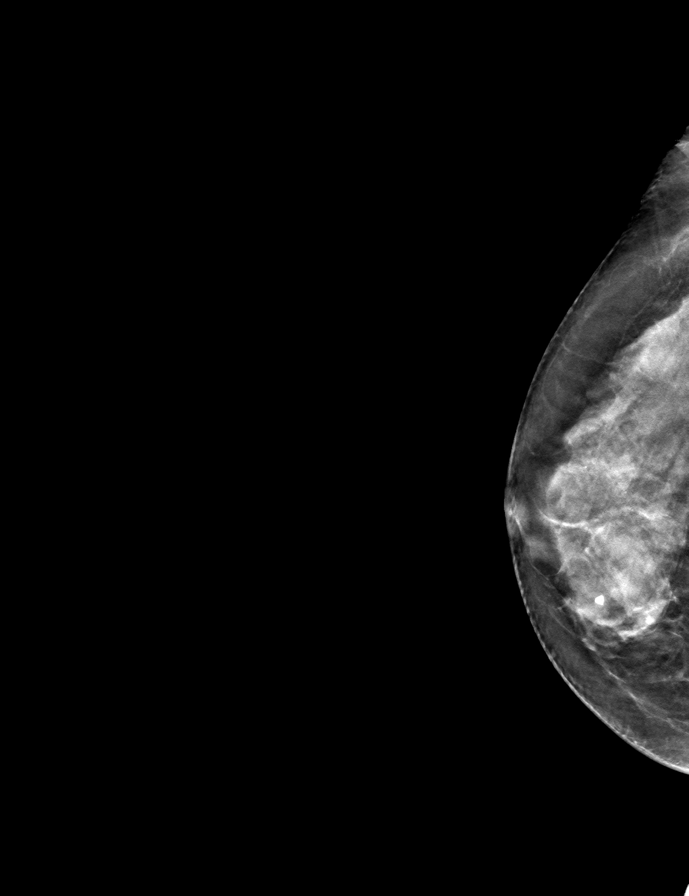

[9 of 24 positions shown; findings below may reference images not displayed]

ACR Breast Density Category d: The breast tissue is extremely dense,
which lowers the sensitivity of mammography
FINDINGS: There are no findings suspicious for malignancy. Images were
processed with CAD.
IMPRESSION: No mammographic evidence of malignancy. A result letter of this
screening mammogram will be mailed directly to the patient.

RECOMMENDATION:
Screening mammogram in one year. (Code:WO-0-ZI0)

BI-RADS CATEGORY  1: Negative.

## 2021-01-27 ENCOUNTER — Other Ambulatory Visit: Payer: Self-pay | Admitting: Internal Medicine

## 2021-01-27 DIAGNOSIS — Z1231 Encounter for screening mammogram for malignant neoplasm of breast: Secondary | ICD-10-CM

## 2021-03-19 ENCOUNTER — Ambulatory Visit: Payer: BC Managed Care – PPO

## 2021-06-16 ENCOUNTER — Other Ambulatory Visit: Payer: Self-pay | Admitting: Internal Medicine

## 2021-06-16 DIAGNOSIS — M8580 Other specified disorders of bone density and structure, unspecified site: Secondary | ICD-10-CM

## 2021-06-17 ENCOUNTER — Other Ambulatory Visit: Payer: Self-pay | Admitting: Internal Medicine

## 2021-06-24 ENCOUNTER — Ambulatory Visit
Admission: RE | Admit: 2021-06-24 | Discharge: 2021-06-24 | Disposition: A | Discharge status: Home / Self Care | Payer: BC Managed Care – PPO | Source: Ambulatory Visit | Attending: Internal Medicine | Admitting: Internal Medicine

## 2021-06-24 DIAGNOSIS — Z1231 Encounter for screening mammogram for malignant neoplasm of breast: Secondary | ICD-10-CM

## 2021-11-30 ENCOUNTER — Ambulatory Visit
Admission: RE | Admit: 2021-11-30 | Discharge: 2021-11-30 | Disposition: A | Discharge status: Home / Self Care | Payer: BC Managed Care – PPO | Source: Ambulatory Visit | Attending: Internal Medicine | Admitting: Internal Medicine

## 2021-11-30 DIAGNOSIS — M8580 Other specified disorders of bone density and structure, unspecified site: Secondary | ICD-10-CM

## 2022-02-16 ENCOUNTER — Ambulatory Visit: Payer: BC Managed Care – PPO | Admitting: Nurse Practitioner

## 2022-09-01 ENCOUNTER — Other Ambulatory Visit: Payer: Self-pay | Admitting: Internal Medicine

## 2022-09-01 DIAGNOSIS — Z1231 Encounter for screening mammogram for malignant neoplasm of breast: Secondary | ICD-10-CM

## 2022-09-22 ENCOUNTER — Ambulatory Visit
Admission: RE | Admit: 2022-09-22 | Discharge: 2022-09-22 | Disposition: A | Discharge status: Home / Self Care | Payer: BC Managed Care – PPO | Source: Ambulatory Visit | Attending: Internal Medicine | Admitting: Internal Medicine

## 2022-09-22 DIAGNOSIS — Z1231 Encounter for screening mammogram for malignant neoplasm of breast: Secondary | ICD-10-CM

## 2023-05-02 ENCOUNTER — Other Ambulatory Visit (HOSPITAL_COMMUNITY)
Admission: RE | Admit: 2023-05-02 | Discharge: 2023-05-02 | Disposition: A | Discharge status: Home / Self Care | Payer: 59 | Source: Ambulatory Visit | Attending: Nurse Practitioner | Admitting: Nurse Practitioner

## 2023-05-02 ENCOUNTER — Encounter: Payer: Self-pay | Admitting: Nurse Practitioner

## 2023-05-02 ENCOUNTER — Ambulatory Visit (INDEPENDENT_AMBULATORY_CARE_PROVIDER_SITE_OTHER): Payer: 59 | Admitting: Nurse Practitioner

## 2023-05-02 VITALS — BP 122/72 | HR 67 | Ht 61.81 in | Wt 105.0 lb

## 2023-05-02 DIAGNOSIS — Z124 Encounter for screening for malignant neoplasm of cervix: Secondary | ICD-10-CM | POA: Diagnosis present

## 2023-05-02 DIAGNOSIS — M8589 Other specified disorders of bone density and structure, multiple sites: Secondary | ICD-10-CM

## 2023-05-02 DIAGNOSIS — Z01419 Encounter for gynecological examination (general) (routine) without abnormal findings: Secondary | ICD-10-CM | POA: Diagnosis not present

## 2023-05-02 DIAGNOSIS — Z780 Asymptomatic menopausal state: Secondary | ICD-10-CM | POA: Diagnosis not present

## 2023-05-02 NOTE — Progress Notes (Signed)
Teresa Dillon [facility] - [place] Jun 08, 1960 [id number]   History:  63 y.o. G0 presents as newly established patient for annual exam. Last seen in our office in 2021. Abnormal pap in her 46s, repeat normal. Postmenopausal - no HRT, no bleeding. HLD, anxiety, depression managed by PCP.   Gynecologic History No LMP recorded. Patient is postmenopausal.   Contraception/Family planning: post menopausal status Sexually active: Yes, same sex partner  Health Maintenance Last Pap: 05/10/2019. Results were: insufficient cells Last mammogram: 09/22/2022. Results were: Normal Last colonoscopy: 02/22/2019. Results were: Tubular adenoma, 5-year recall Last Dexa: 11/30/2021. Results were: T-score-2.2, FRAX 9.0% / 1.5% Exercising: Yes. Gardening, walking  Smoker: Yes, socially  Past medical history, past surgical history, family history and social history were all reviewed and documented in the EPIC chart. Married. Retired Teacher, early years/pre.   ROS:  A ROS was performed and pertinent positives and negatives are included.  Exam:  Vitals:   05/02/23 1026  BP: 122/72  Pulse: 67  SpO2: 98%  Weight: 105 lb (47.6 kg)  Height: 5' 1.81" (1.57 m)   Body mass index is 19.32 kg/m.  General appearance:  Normal Thyroid:  Symmetrical, normal in size, without palpable masses or nodularity. Respiratory  Auscultation:  Clear without wheezing or rhonchi Cardiovascular  Auscultation:  Regular rate, without rubs, murmurs or gallops  Edema/varicosities:  Not grossly evident Abdominal  Soft,nontender, without masses, guarding or rebound.  Liver/spleen:  No organomegaly noted  Hernia:  None appreciated  Skin  Inspection:  Grossly normal Breasts: Examined lying and sitting.   Right: Without masses, retractions, nipple discharge or axillary adenopathy.   Left: Without masses, retractions, nipple discharge or axillary adenopathy. Pelvic: External genitalia:  no lesions              Urethra:  normal appearing urethra with no  masses, tenderness or lesions              Bartholins and Skenes: normal                 Vagina: normal appearing vagina with normal color and discharge, no lesions. Atrophic changes              Cervix: no lesions Bimanual Exam:  Uterus:  no masses or tenderness              Adnexa: no mass, fullness, tenderness              Rectovaginal: Deferred              Anus:  normal, no lesions  Patient informed chaperone available to be present for breast and pelvic exam. Patient has requested no chaperone to be present. Patient has been advised what will be completed during breast and pelvic exam.   Assessment/Plan:  63 y.o. G0 for annual exam.   Well female exam with routine gynecological exam - Education provided on SBEs, importance of preventative screenings, current guidelines, high calcium diet, regular exercise, and multivitamin daily.  Labs with PCP.   Postmenopausal - no HRT, no bleeding  Cervical cancer screening - Plan: Cytology - PAP( [facility]). Abnormal pap in her 62s, repeat normal.   Osteopenia of multiple sites - Managed by PCP. 11/2021 T-score -2.2 without elevated FRAX. Continue Vit D + Calcium and regular exercise.   Screening for breast cancer - Normal mammogram history.  Continue annual screenings.  Normal breast exam today.  Screening for colon cancer - 2020 colonoscopy. Will repeat at 5-year interval per GI's recommendation.  Return in about 1 year (around 05/01/2024) for Annual.  Teresa Mackie DNP, 10:40 AM 05/02/2023

## 2023-05-05 LAB — CYTOLOGY - PAP
Adequacy: ABSENT
Comment: NEGATIVE
Diagnosis: UNDETERMINED — AB
High risk HPV: POSITIVE — AB

## 2023-05-08 ENCOUNTER — Encounter: Payer: Self-pay | Admitting: Nurse Practitioner

## 2024-01-19 ENCOUNTER — Other Ambulatory Visit: Payer: Self-pay | Admitting: Internal Medicine

## 2024-01-19 DIAGNOSIS — Z1231 Encounter for screening mammogram for malignant neoplasm of breast: Secondary | ICD-10-CM

## 2024-02-07 ENCOUNTER — Ambulatory Visit
Admission: RE | Admit: 2024-02-07 | Discharge: 2024-02-07 | Disposition: A | Source: Ambulatory Visit | Attending: Internal Medicine | Admitting: Internal Medicine

## 2024-02-07 DIAGNOSIS — Z1231 Encounter for screening mammogram for malignant neoplasm of breast: Secondary | ICD-10-CM

## 2024-05-07 ENCOUNTER — Ambulatory Visit: Payer: 59 | Admitting: Nurse Practitioner

## 2024-07-23 ENCOUNTER — Ambulatory Visit: Admitting: Nurse Practitioner
